# Patient Record
Sex: Male | Born: 1975 | Hispanic: No | Marital: Married | Smoking: Never smoker
Health system: Southern US, Community
[De-identification: ages and names within clinical notes are randomized; demographics above are authoritative.]

## PROBLEM LIST (undated history)

## (undated) DIAGNOSIS — M1990 Unspecified osteoarthritis, unspecified site: Secondary | ICD-10-CM

## (undated) DIAGNOSIS — I2510 Atherosclerotic heart disease of native coronary artery without angina pectoris: Secondary | ICD-10-CM

## (undated) DIAGNOSIS — I10 Essential (primary) hypertension: Secondary | ICD-10-CM

## (undated) DIAGNOSIS — E119 Type 2 diabetes mellitus without complications: Secondary | ICD-10-CM

---

## 2018-05-22 ENCOUNTER — Emergency Department (HOSPITAL_BASED_OUTPATIENT_CLINIC_OR_DEPARTMENT_OTHER)
Admission: EM | Admit: 2018-05-22 | Discharge: 2018-05-22 | Disposition: A | Discharge status: Home / Self Care | Payer: Medicaid - Out of State | Attending: Emergency Medicine | Admitting: Emergency Medicine

## 2018-05-22 ENCOUNTER — Other Ambulatory Visit: Payer: Self-pay

## 2018-05-22 ENCOUNTER — Encounter (HOSPITAL_BASED_OUTPATIENT_CLINIC_OR_DEPARTMENT_OTHER): Payer: Self-pay | Admitting: Emergency Medicine

## 2018-05-22 ENCOUNTER — Emergency Department (HOSPITAL_BASED_OUTPATIENT_CLINIC_OR_DEPARTMENT_OTHER): Payer: Medicaid - Out of State

## 2018-05-22 DIAGNOSIS — S60222A Contusion of left hand, initial encounter: Secondary | ICD-10-CM | POA: Diagnosis not present

## 2018-05-22 DIAGNOSIS — Z7984 Long term (current) use of oral hypoglycemic drugs: Secondary | ICD-10-CM | POA: Insufficient documentation

## 2018-05-22 DIAGNOSIS — Y939 Activity, unspecified: Secondary | ICD-10-CM | POA: Diagnosis not present

## 2018-05-22 DIAGNOSIS — S6992XA Unspecified injury of left wrist, hand and finger(s), initial encounter: Secondary | ICD-10-CM | POA: Diagnosis present

## 2018-05-22 DIAGNOSIS — W231XXA Caught, crushed, jammed, or pinched between stationary objects, initial encounter: Secondary | ICD-10-CM | POA: Diagnosis not present

## 2018-05-22 DIAGNOSIS — E119 Type 2 diabetes mellitus without complications: Secondary | ICD-10-CM | POA: Insufficient documentation

## 2018-05-22 DIAGNOSIS — Y999 Unspecified external cause status: Secondary | ICD-10-CM | POA: Diagnosis not present

## 2018-05-22 DIAGNOSIS — I2510 Atherosclerotic heart disease of native coronary artery without angina pectoris: Secondary | ICD-10-CM | POA: Insufficient documentation

## 2018-05-22 DIAGNOSIS — I10 Essential (primary) hypertension: Secondary | ICD-10-CM | POA: Diagnosis not present

## 2018-05-22 DIAGNOSIS — Y929 Unspecified place or not applicable: Secondary | ICD-10-CM | POA: Diagnosis not present

## 2018-05-22 DIAGNOSIS — S60212A Contusion of left wrist, initial encounter: Secondary | ICD-10-CM | POA: Diagnosis not present

## 2018-05-22 HISTORY — DX: Unspecified osteoarthritis, unspecified site: M19.90

## 2018-05-22 HISTORY — DX: Atherosclerotic heart disease of native coronary artery without angina pectoris: I25.10

## 2018-05-22 HISTORY — DX: Type 2 diabetes mellitus without complications: E11.9

## 2018-05-22 HISTORY — DX: Essential (primary) hypertension: I10

## 2018-05-22 NOTE — ED Provider Notes (Signed)
[facility] EMERGENCY DEPARTMENT Provider Note   CSN: [id number] Arrival date & time: 05/22/18  1455     History   Chief Complaint Chief Complaint  Patient presents with  . Hand Pain    HPI Patrick Beasley is a 42 y.o. male with a PMHx of DM2, HTN, CAD, and psoriatic arthritis, who presents to the ED with complaints of left hand and wrist pain x 4 days.  Patient states that he accidentally closed the trunk on his left hand and wrist, somewhat obliquely across the dorsum of the wrist and hand.  He had pain immediately after, but it had improved until yesterday when he tried to grab something and it fell, he tried to catch it and he twisted his hand and wrist and the pain began again.  He describes the pain as 9/10 constant sharp left hand and wrist pain that radiates into the elbow, worse with movement of the hand or wrist, somewhat improved with ice, and unrelieved with ibuprofen.  He reports associated swelling.  He denies any bruises, abrasions, numbness, tingling, focal weakness, or any other complaints at this time.  No other injury sustained during the incident.  He does not have a hand specialist that he sees.  The history is provided by the patient and medical records. No language interpreter was used.  Hand Pain     Past Medical History:  Diagnosis Date  . Arthritis   . Coronary artery disease   . Diabetes mellitus without complication (HCC)   . Hypertension     There are no active problems to display for this patient.   History reviewed. No pertinent surgical history.      Home Medications    Prior to Admission medications   Medication Sig Start Date End Date Taking? Authorizing Provider  glipiZIDE (GLUCOTROL) 10 MG tablet Take 10 mg by mouth daily before breakfast.   Yes [provider]  metFORMIN (GLUCOPHAGE) 1000 MG tablet Take 1,000 mg by mouth 2 (two) times daily with a meal.   Yes [provider]    Family History History  reviewed. No pertinent family history.  Social History Social History   Tobacco Use  . Smoking status: Never Smoker  . Smokeless tobacco: Never Used  Substance Use Topics  . Alcohol use: Never    Frequency: Never  . Drug use: Never     Allergies   Patient has no known allergies.   Review of Systems Review of Systems  Musculoskeletal: Positive for arthralgias and joint swelling.  Skin: Negative for color change and wound.  Allergic/Immunologic: Positive for immunocompromised state (DM2).  Neurological: Negative for weakness and numbness.  Psychiatric/Behavioral: Negative for confusion.     Physical Exam Updated Vital Signs BP 120/69 (BP Location: Right Arm)   Pulse 87   Temp 98.5 F (36.9 C) (Oral)   Resp 18   Ht 5\' 8"  (1.727 m)   Wt 81.6 kg (180 lb)   SpO2 98%   BMI 27.37 kg/m   Physical Exam  Constitutional: He is oriented to person, place, and time. Vital signs are normal. He appears well-developed and well-nourished.  Non-toxic appearance. No distress.  Afebrile, nontoxic, NAD  HENT:  Head: Normocephalic and atraumatic.  Mouth/Throat: Mucous membranes are normal.  Eyes: Conjunctivae and EOM are normal. Right eye exhibits no discharge. Left eye exhibits no discharge.  Neck: Normal range of motion. Neck supple.  Cardiovascular: Normal rate and intact distal pulses.  Pulmonary/Chest: Effort normal. No respiratory distress.  Abdominal: Normal appearance. He exhibits no distension.  Musculoskeletal: Normal range of motion.       Left wrist: He exhibits tenderness, bony tenderness and swelling. He exhibits normal range of motion, no effusion, no crepitus, no deformity and no laceration.       Left hand: He exhibits tenderness, bony tenderness and swelling. He exhibits normal range of motion, normal two-point discrimination, normal capillary refill, no deformity and no laceration. Normal sensation noted. Normal strength noted.  L hand and wrist with mostly FROM  intact, wiggles all fingers although c/o pain with this but still able to wiggle them fully, with mild TTP diffusely across dorsal aspect of wrist and 3rd-5th metacarpal areas, no crepitus or deformity, no anatomical snuffbox tenderness, very mild swelling on dorsum of wrist/hand, but no bruising, no erythema or warmth. No wounds/abrasions. Strength and sensation grossly intact, distal pulses intact, compartments soft. No tenderness to the proximal forearm or elbow.   Neurological: He is alert and oriented to person, place, and time. He has normal strength. No sensory deficit.  Skin: Skin is warm, dry and intact. No rash noted.  Psychiatric: He has a normal mood and affect.  Nursing note and vitals reviewed.    ED Treatments / Results  Labs (all labs ordered are listed, but only abnormal results are displayed) Labs Reviewed - No data to display  EKG None  Radiology Dg Wrist Complete Left  Result Date: 05/22/2018 CLINICAL DATA:  42 y/o M; crush injury with pain over the metacarpals of second, third, and fourth digits. EXAM: LEFT WRIST - COMPLETE 3+ VIEW; LEFT HAND - COMPLETE 3+ VIEW COMPARISON:  None. FINDINGS: Left wrist: There is no evidence of fracture or dislocation. There is no evidence of arthropathy or other focal bone abnormality. Soft tissues are unremarkable. Left hand: There is no evidence of fracture or dislocation. There is no evidence of arthropathy or other focal bone abnormality. Soft tissues are unremarkable. IMPRESSION: No acute fracture or dislocation identified. Electronically Signed   By: Mitzi Hansen M.D.   On: 05/22/2018 16:53   Dg Hand Complete Left  Result Date: 05/22/2018 CLINICAL DATA:  42 y/o M; crush injury with pain over the metacarpals of second, third, and fourth digits. EXAM: LEFT WRIST - COMPLETE 3+ VIEW; LEFT HAND - COMPLETE 3+ VIEW COMPARISON:  None. FINDINGS: Left wrist: There is no evidence of fracture or dislocation. There is no evidence of  arthropathy or other focal bone abnormality. Soft tissues are unremarkable. Left hand: There is no evidence of fracture or dislocation. There is no evidence of arthropathy or other focal bone abnormality. Soft tissues are unremarkable. IMPRESSION: No acute fracture or dislocation identified. Electronically Signed   By: Mitzi Hansen M.D.   On: 05/22/2018 16:53    Procedures Procedures (including critical care time)  Medications Ordered in ED Medications - No data to display   Initial Impression / Assessment and Plan / ED Course  I have reviewed the triage vital signs and the nursing notes.  Pertinent labs & imaging results that were available during my care of the patient were reviewed by me and considered in my medical decision making (see chart for details).     42 y.o. male here with L wrist and hand pain x4 days after closing trunk accidentally on his hand/wrist. On exam, very mild swelling to dorsum of hand/wrist, no bruising, mild diffuse TTP to wrist and into 3rd-5th metacarpals, no crepitus or deformity, no anatomical snuffbox tenderness, wiggles all fingers although  c/o pain with this, NVI with soft compartments, no tenderness into elbow/proximal forearm. Xrays of hand/wrist negative for acute fx/dislocation. Doubt compartment syndrome or other emergent pathology. Likely just a contusion of the hand/wrist. Advised RICE, will ACE wrap here, advised tylenol/motrin for pain, and f/up with PCP in 1wk for recheck of symptoms. I explained the diagnosis and have given explicit precautions to return to the ER including for any other new or worsening symptoms. The patient understands and accepts the medical plan as it's been dictated and I have answered their questions. Discharge instructions concerning home care and prescriptions have been given. The patient is STABLE and is discharged to home in good condition.    Final Clinical Impressions(s) / ED Diagnoses   Final diagnoses:    Contusion of left hand, initial encounter  Contusion of left wrist, initial encounter    ED Discharge Orders    [address] [place], [place] 05/22/18 1715    Charlynne Pander, MD 05/22/18 [phone]

## 2018-05-22 NOTE — ED Triage Notes (Signed)
Patient states that he crushed his left hand about 3 days ago. He continues to have pain to it today - he has a brace to his left hand at this time

## 2018-05-22 NOTE — Discharge Instructions (Signed)
Wear ACE wrap to hand/wrist for comfort and compression. Ice and elevate hand/wrist throughout the day, using ice pack for no more than 20 minutes every hour.  Alternate between tylenol and ibuprofen as needed for pain relief. Follow up with your regular doctor in 1 week for recheck of symptoms. Return to the ER for changes or worsening symptoms.

## 2019-08-03 IMAGING — CR DG HAND COMPLETE 3+V*L*
3 series · 3 of 3 positions shown · non-contrast
Comparison: None.

CLINICAL DATA: 41 y/o M; crush injury with pain over the
metacarpals of second, third, and fourth digits.

EXAM:
LEFT WRIST - COMPLETE 3+ VIEW; LEFT HAND - COMPLETE 3+ VIEW

[x hand pa left]
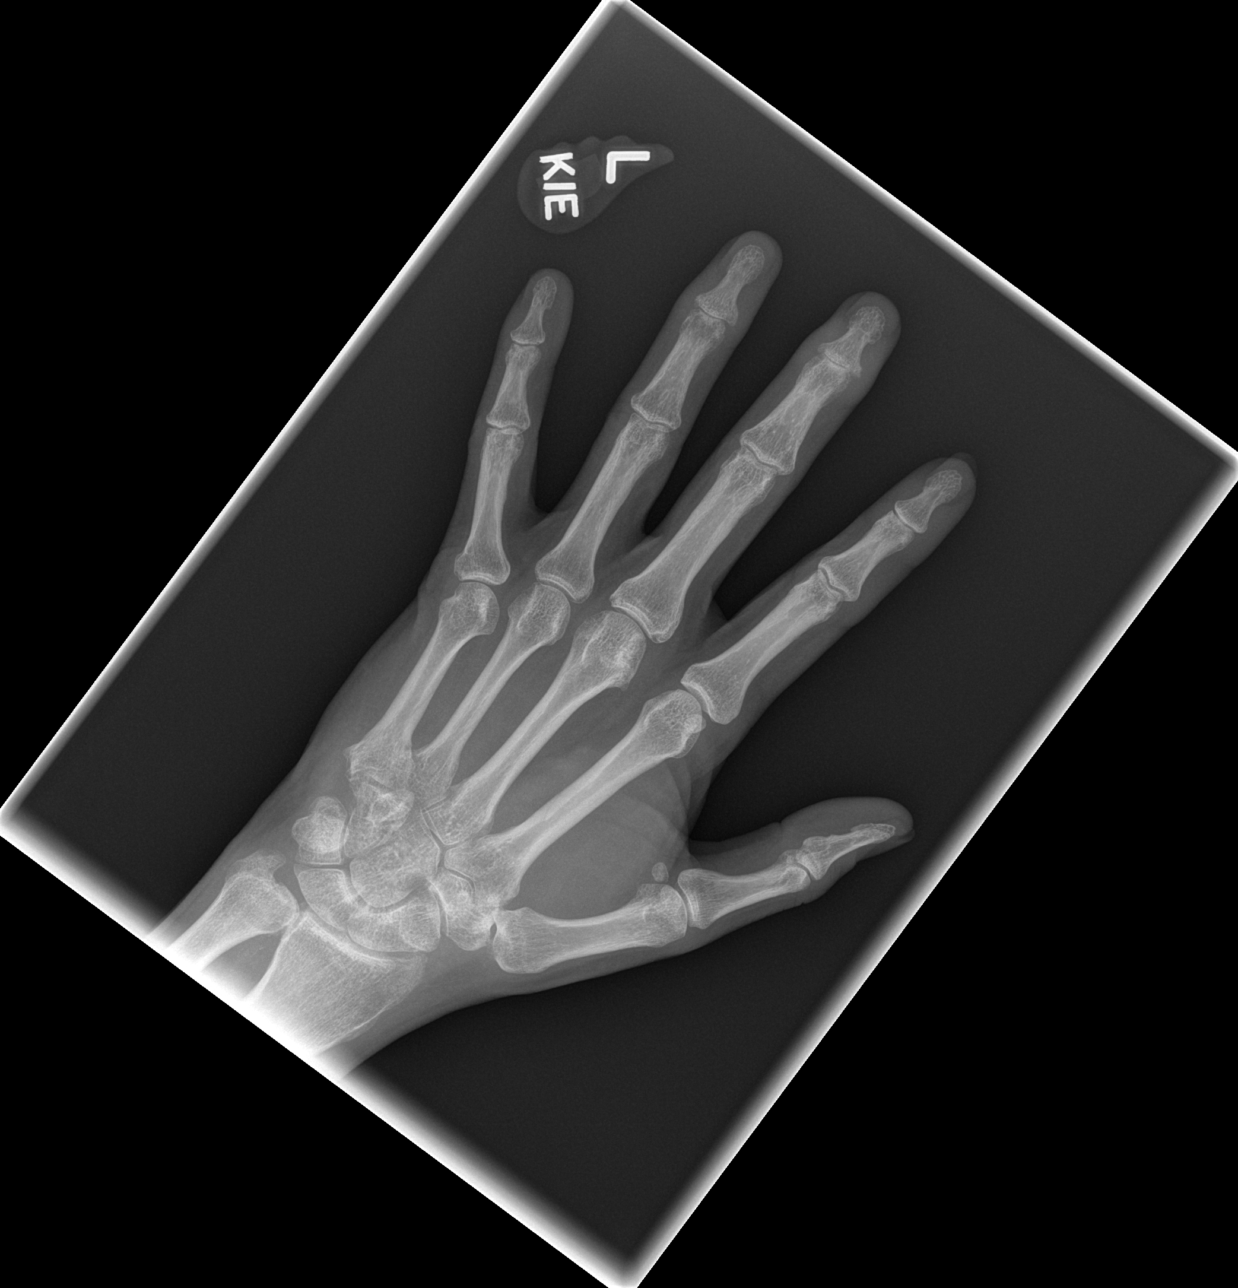

[x hand oblique left]
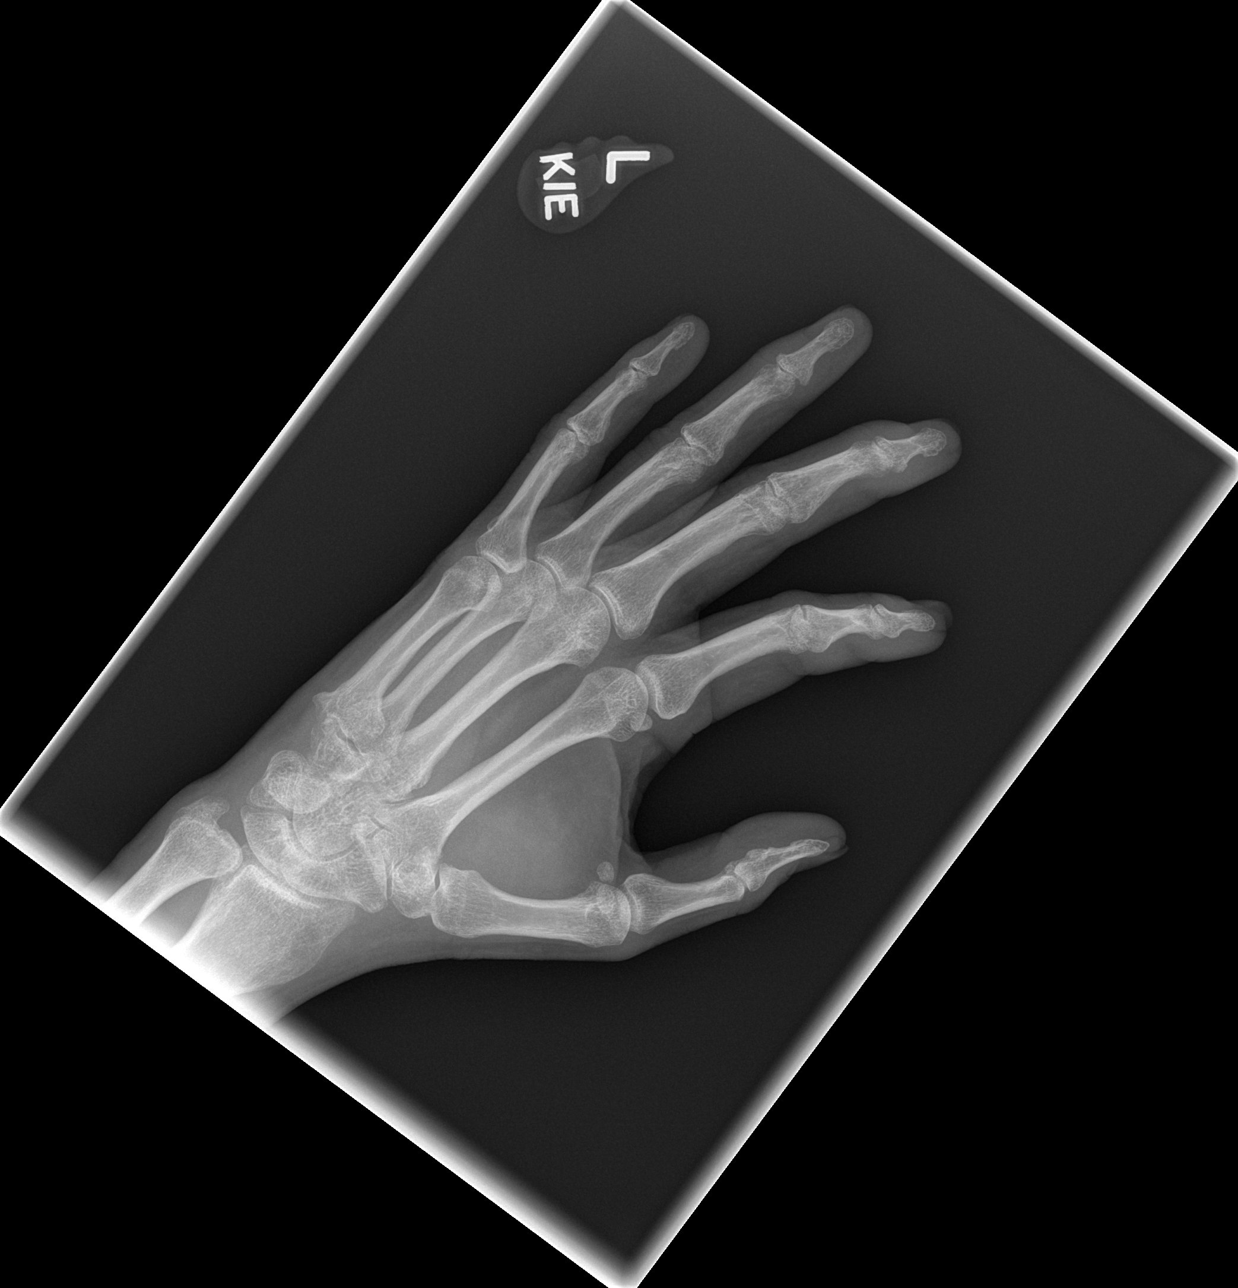

[x hand lat left]
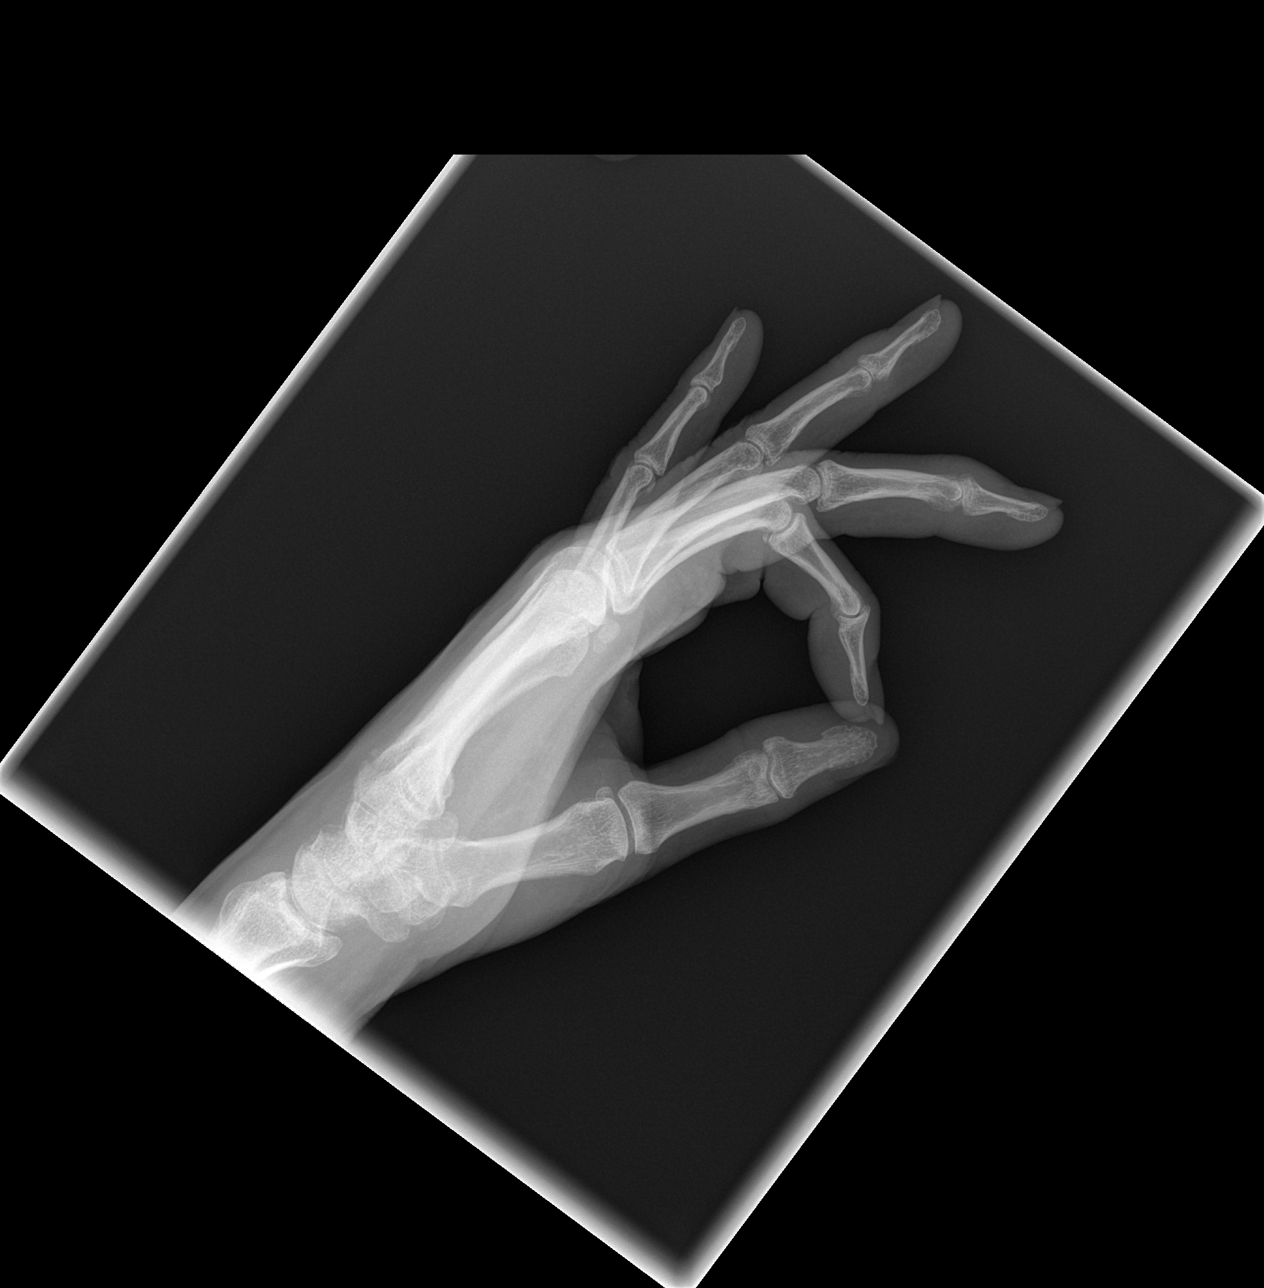

[3 of 3 positions shown; findings below may reference images not displayed]

FINDINGS: Left wrist:

There is no evidence of fracture or dislocation. There is no
evidence of arthropathy or other focal bone abnormality. Soft
tissues are unremarkable.

Left hand:

There is no evidence of fracture or dislocation. There is no
evidence of arthropathy or other focal bone abnormality. Soft
tissues are unremarkable.
IMPRESSION: No acute fracture or dislocation identified.

By: Angelic Simeon M.D.
